# Patient Record
Sex: Male | Born: 2000 | Hispanic: No | Marital: Single | Smoking: Never smoker
Health system: Southern US, Community
[De-identification: ages and names within clinical notes are randomized; demographics above are authoritative.]

---
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
## 2020-01-05 IMAGING — CT CT NECK WITH CONTRAST
4 of 5 series · 15 of 33 positions shown, 17 images · IV contrast (APPLIED)
Comparison: None.

CLINICAL DATA: Right-sided facial swelling beginning 4 days ago.
Difficulty chewing and sleeping.

EXAM:
CT NECK WITH CONTRAST
TECHNIQUE: Multidetector CT imaging of the neck was performed using the
standard protocol following the bolus administration of intravenous
contrast.
CONTRAST:  75mL OMNIPAQUE IOHEXOL 300 MG/ML  SOLN

[Series 3: neck 2.0 i31s 3 · axial · 0.46mm/px · z∈[-174,-18]mm · 4 of 130 slices shown, 5 images]
[im 26/130  soft-tissue]
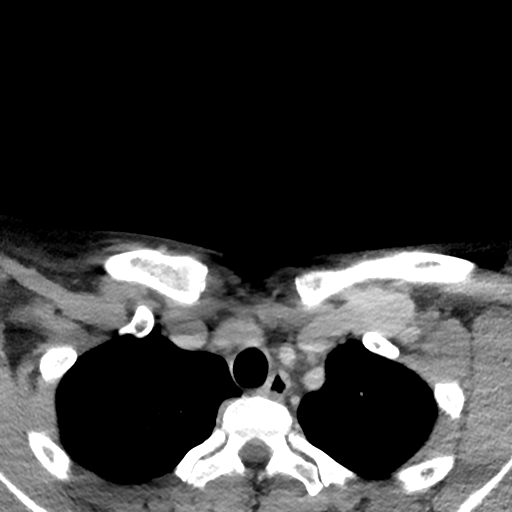
[im 26/130  bone]
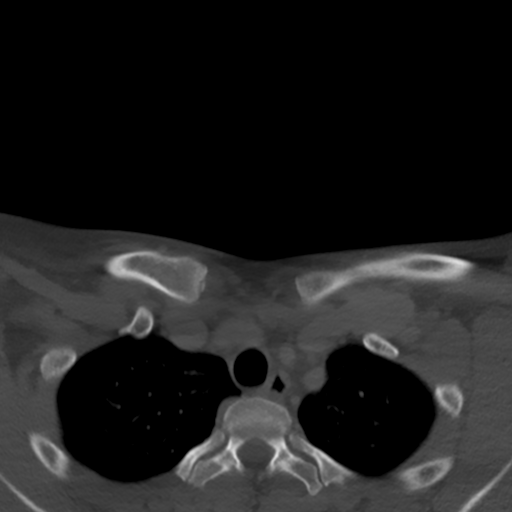
[im 52/130  bone]
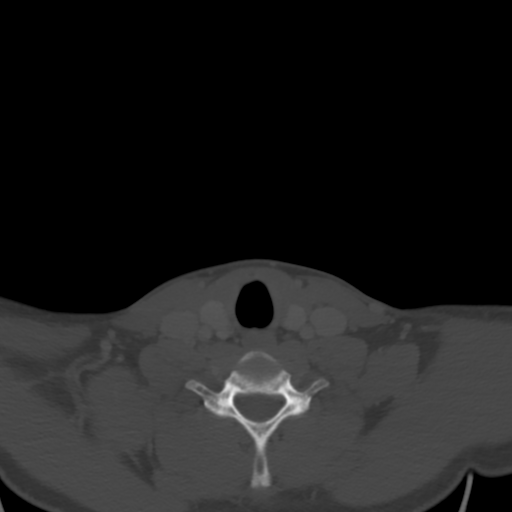
[im 78/130  bone]
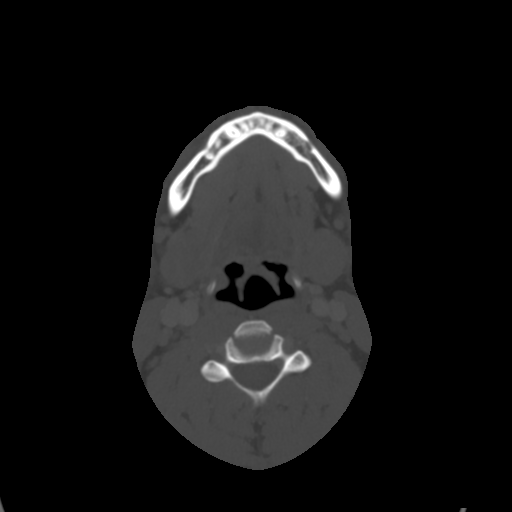
[im 104/130  bone]
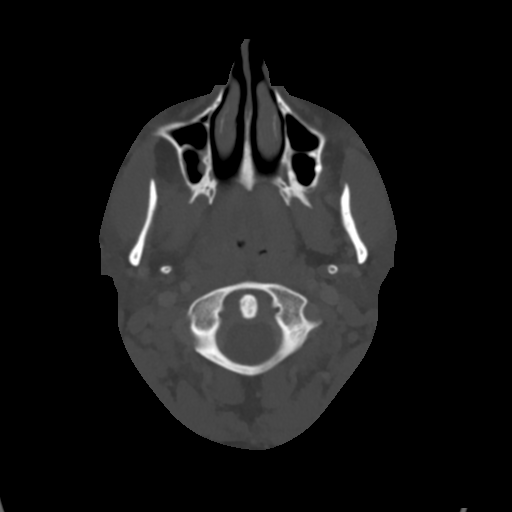

[Series 6: coronal st · coronal · 0.36mm/px · 3 of 101 slices shown]
[im 21/101  bone]
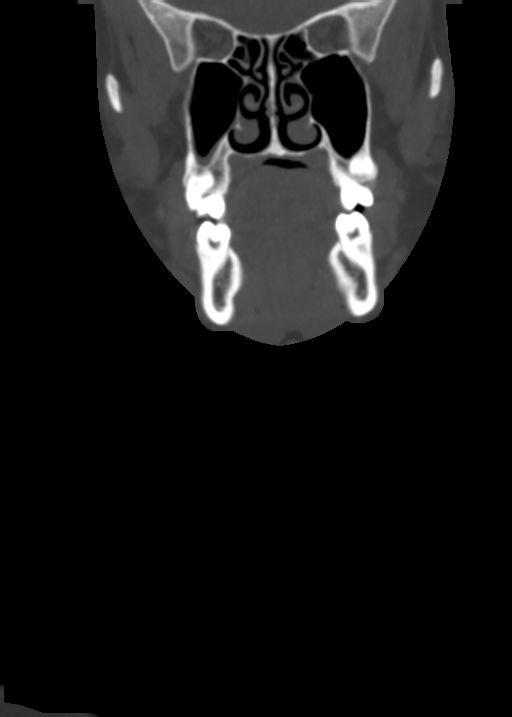
[im 41/101  bone]
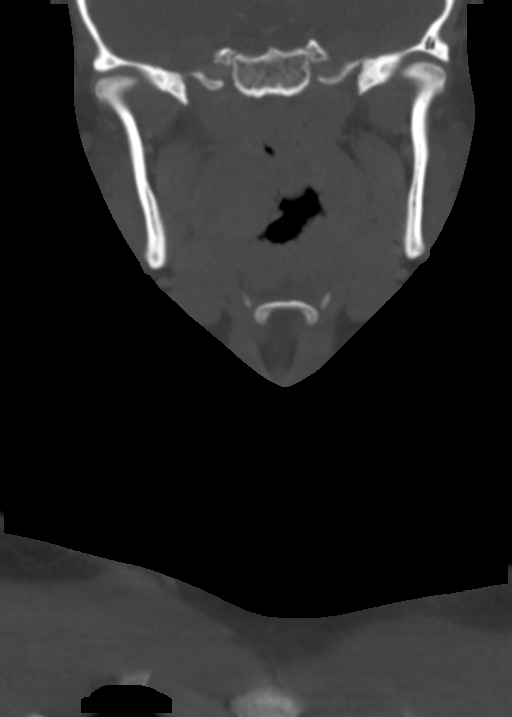
[im 61/101  bone]
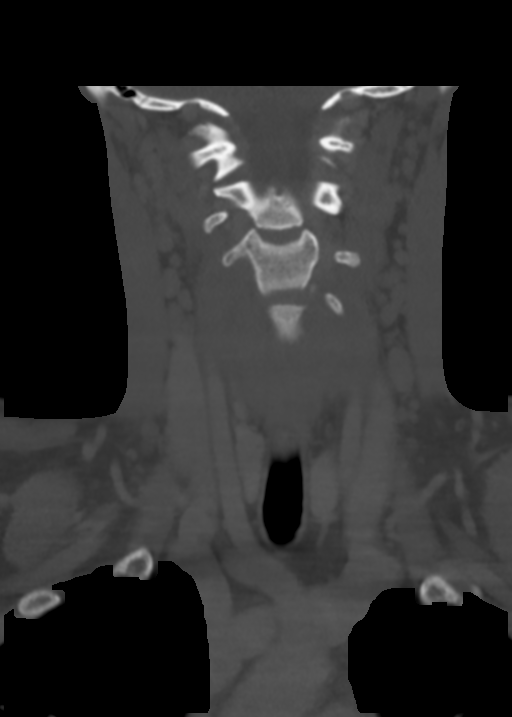

[Series 7: sagittal st · sagittal · 0.49mm/px · 5 of 101 slices shown, 6 images]
[im 34/101  bone]
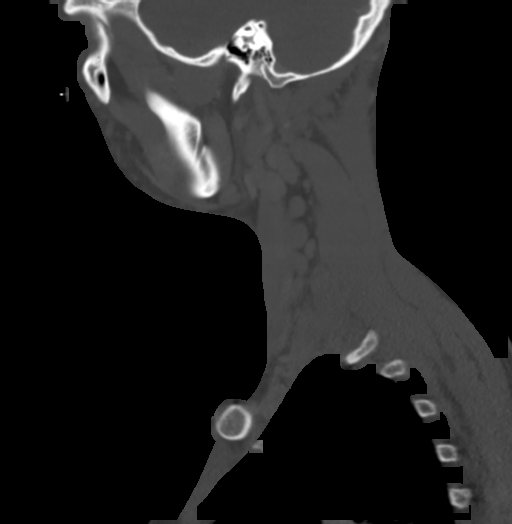
[im 42/101  bone]
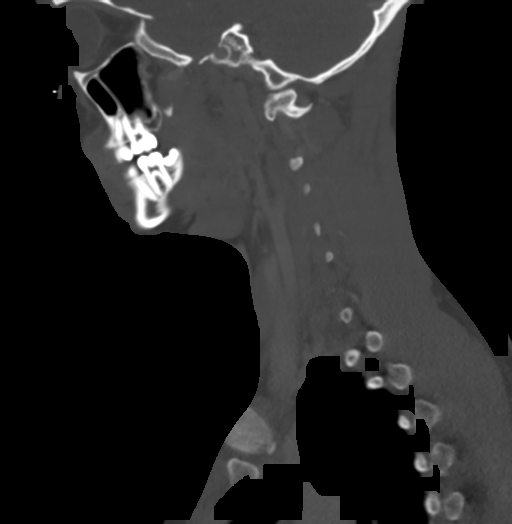
[im 51/101  soft-tissue]
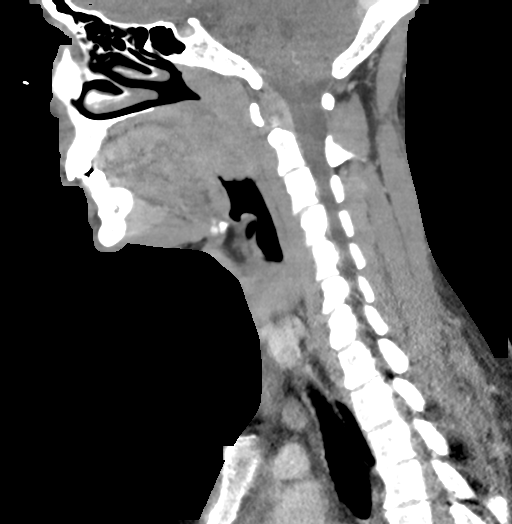
[im 51/101  bone]
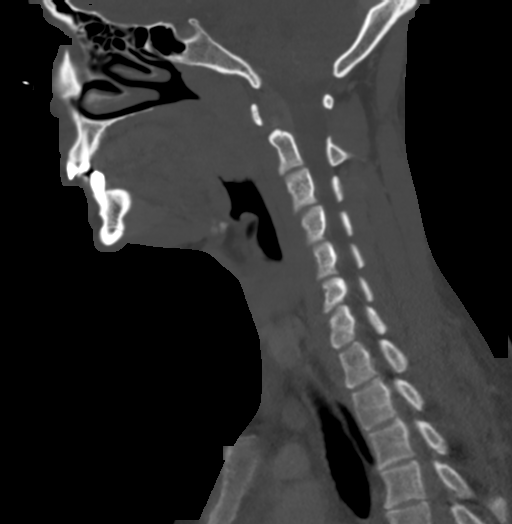
[im 59/101  bone]
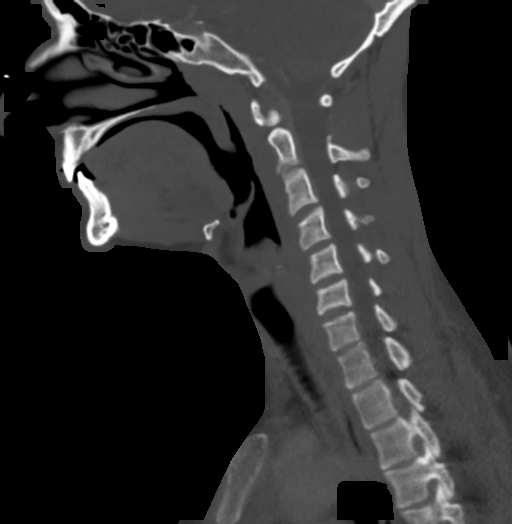
[im 67/101  bone]
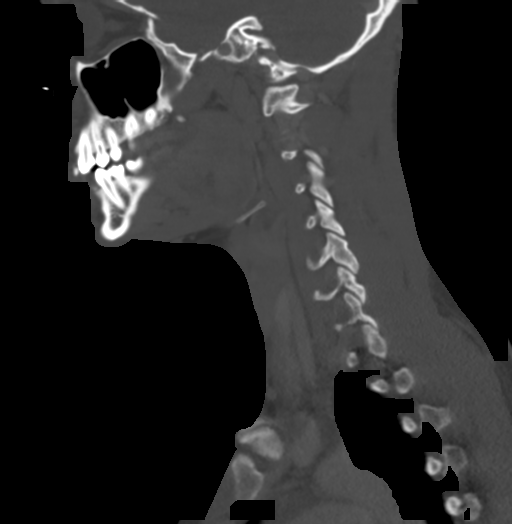

[Series 9: orthogonal st · axial · 0.39mm/px · z∈[-170,-68]mm · 3 of 129 slices shown]
[im 26/129  bone]
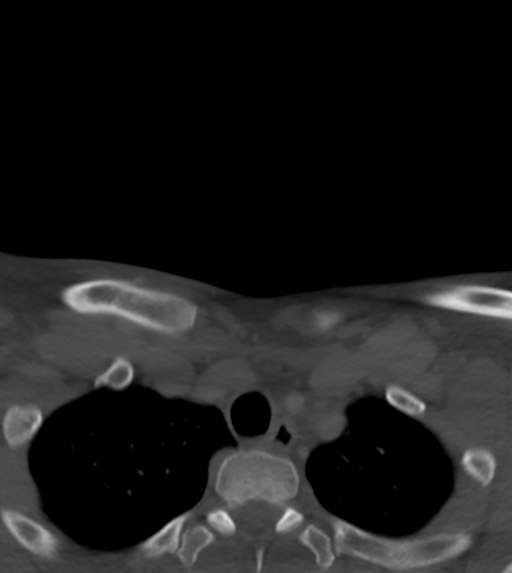
[im 52/129  bone]
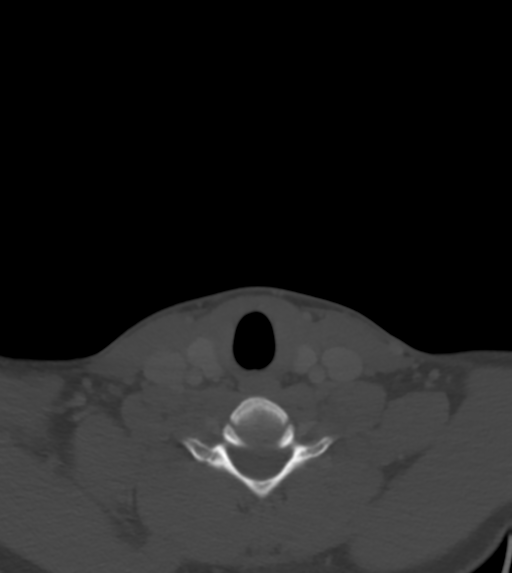
[im 77/129  bone]
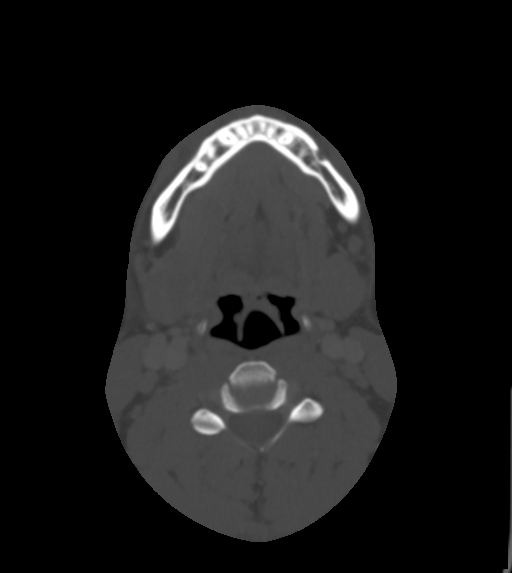

[15 of 33 positions shown; findings below may reference images not displayed]

FINDINGS: Pharynx and larynx: The patient does have bilateral tonsillar
enlargement right more than left. There is a 2 cm in diameter
hypoenhancing region along the anterior margin of the tonsil
consistent with at least peritonsillar phlegmonous inflammation and
possibly an early peritonsillar abscess. No evidence of other deep
space extension. No other mucosal or submucosal lesions seen.

Salivary glands: Parotid and submandibular glands are normal.

Thyroid: Normal

Lymph nodes: Mild reactive nodal prominence. No single enlarged node
or low-density node.

Vascular: Normal

Limited intracranial: Normal

Visualized orbits: Normal

Mastoids and visualized paranasal sinuses: Clear

Skeleton: Normal

Upper chest: Normal

Other: None
IMPRESSION: Bilateral tonsillitis right worse than left. Peritonsillar
inflammatory disease anterior to the tonsil on the right, consistent
at least with phlegmonous inflammation and probably with early frank
peritonsillar abscess. This measures about 2 cm.
